# Patient Record
Sex: Female | Born: 1995 | Race: Black or African American | Hispanic: No | Marital: Single | Smoking: Never smoker
Health system: Southern US, Community
[De-identification: ages and names within clinical notes are randomized; demographics above are authoritative.]

---
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
## 2018-02-02 ENCOUNTER — Emergency Department
Admission: EM | Admit: 2018-02-02 | Discharge: 2018-02-02 | Disposition: A | Discharge status: Home / Self Care | Payer: BC Managed Care – PPO | Attending: Emergency Medicine | Admitting: Emergency Medicine

## 2018-02-02 ENCOUNTER — Other Ambulatory Visit: Payer: Self-pay

## 2018-02-02 DIAGNOSIS — F10920 Alcohol use, unspecified with intoxication, uncomplicated: Secondary | ICD-10-CM

## 2018-02-02 DIAGNOSIS — M542 Cervicalgia: Secondary | ICD-10-CM | POA: Diagnosis present

## 2018-02-02 DIAGNOSIS — M7918 Myalgia, other site: Secondary | ICD-10-CM | POA: Insufficient documentation

## 2018-02-02 DIAGNOSIS — F10120 Alcohol abuse with intoxication, uncomplicated: Secondary | ICD-10-CM | POA: Diagnosis not present

## 2018-02-02 LAB — COMPREHENSIVE METABOLIC PANEL
ALT: 17 U/L (ref 0–44)
ANION GAP: 11 (ref 5–15)
AST: 25 U/L (ref 15–41)
Albumin: 3.8 g/dL (ref 3.5–5.0)
Alkaline Phosphatase: 72 U/L (ref 38–126)
BILIRUBIN TOTAL: 0.4 mg/dL (ref 0.3–1.2)
BUN: 8 mg/dL (ref 6–20)
CO2: 22 mmol/L (ref 22–32)
Calcium: 9 mg/dL (ref 8.9–10.3)
Chloride: 110 mmol/L (ref 98–111)
Creatinine, Ser: 0.91 mg/dL (ref 0.44–1.00)
GFR calc Af Amer: >60 mL/min (ref >60)
Glucose, Bld: 104 mg/dL — ABNORMAL HIGH (ref 70–99)
Potassium: 3.4 mmol/L — ABNORMAL LOW (ref 3.5–5.1)
SODIUM: 143 mmol/L (ref 135–145)
TOTAL PROTEIN: 8.1 g/dL (ref 6.5–8.1)

## 2018-02-02 LAB — CBC
HEMATOCRIT: 39.1 % (ref 35.0–47.0)
Hemoglobin: 13.2 g/dL (ref 12.0–16.0)
MCH: 27.8 pg (ref 26.0–34.0)
MCHC: 33.7 g/dL (ref 32.0–36.0)
MCV: 82.6 fL (ref 80.0–100.0)
Platelets: 333 10*3/uL (ref 150–440)
RBC: 4.73 MIL/uL (ref 3.80–5.20)
RDW: 15.1 % — ABNORMAL HIGH (ref 11.5–14.5)
WBC: 8.5 10*3/uL (ref 3.6–11.0)

## 2018-02-02 LAB — ETHANOL: Alcohol, Ethyl (B): 227 mg/dL — ABNORMAL HIGH (ref <10)

## 2018-02-02 MED ORDER — ACETAMINOPHEN 500 MG PO TABS
1000.0000 mg | ORAL_TABLET | Freq: Once | ORAL | Status: AC
  Administered 2018-02-02: 1000 mg via ORAL

## 2018-02-02 MED ORDER — BACITRACIN ZINC 500 UNIT/GM EX OINT
TOPICAL_OINTMENT | CUTANEOUS | Status: AC
  Administered 2018-02-02: 1
  Filled 2018-02-02: qty 0.9

## 2018-02-02 MED ORDER — SODIUM CHLORIDE 0.9 % IV BOLUS
1000.0000 mL | Freq: Once | INTRAVENOUS | Status: AC
  Administered 2018-02-02: 1000 mL via INTRAVENOUS

## 2018-02-02 MED ORDER — BACITRACIN-NEOMYCIN-POLYMYXIN 400-5-5000 EX OINT: TOPICAL_OINTMENT | Freq: Once | CUTANEOUS | Status: DC

## 2018-02-02 MED ORDER — LORAZEPAM 2 MG/ML IJ SOLN
0.5000 mg | Freq: Once | INTRAMUSCULAR | Status: DC
  Filled 2018-02-02: qty 1

## 2018-02-02 MED ORDER — ACETAMINOPHEN 500 MG PO TABS
ORAL_TABLET | ORAL | Status: AC
  Filled 2018-02-02: qty 2
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
## 2018-02-02 NOTE — Discharge Instructions (Signed)
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
